# Patient Record
Sex: Male | Born: 1990 | Hispanic: No | Marital: Married | Smoking: Never smoker
Health system: Southern US, Community
[De-identification: ages and names within clinical notes are randomized; demographics above are authoritative.]

---

## 2018-08-11 ENCOUNTER — Telehealth: Payer: BLUE CROSS/BLUE SHIELD | Admitting: Nurse Practitioner

## 2018-08-11 DIAGNOSIS — Z7189 Other specified counseling: Secondary | ICD-10-CM

## 2018-08-11 NOTE — Progress Notes (Signed)
E-Visit for Corona Virus Screening  Based on your current symptoms, you may very well have the virus, however your symptoms are mild. Currently, not all patients are being tested. If the symptoms are mild and there is not a known exposure, performing the test is not indicated.  Coronavirus disease 2019 (COVID-19) is a respiratory illness that can spread from person to person. The virus that causes COVID-19 is a new virus that was first identified in the country of [place] but is now found in multiple other countries and has spread to the [place].  Symptoms associated with the virus are mild to severe fever, cough, and shortness of breath. There is currently no vaccine to protect against COVID-19, and there is no specific antiviral treatment for the virus.   To be considered HIGH RISK for Coronavirus (COVID-19), you have to meet the following criteria:  . Traveled to [place], [place], [place], [place] or [place]; or in the [place] to [place], [place], [place], or [place]; and have fever, cough, and shortness of breath within the last 2 weeks of travel OR  . Been in close contact with a person diagnosed with COVID-19 within the last 2 weeks and have fever, cough, and shortness of breath  . IF YOU DO NOT MEET THESE CRITERIA, YOU ARE CONSIDERED LOW RISK FOR COVID-19.   It is vitally important that if you feel that you have an infection such as this virus or any other virus that you stay home and away from places where you may spread it to others.  You should self-quarantine for 14 days if you have symptoms that could potentially be coronavirus and avoid contact with people age 65 and older.   You can use medication such as delsym or mucinex for cough  You may also take acetaminophen (Tylenol) as needed for fever.   Reduce your risk of any infection by using the same precautions used for avoiding the common cold or flu:  . Wash your hands often with soap and warm water for at least 20  seconds.  If soap and water are not readily available, use an alcohol-based hand sanitizer with at least 60% alcohol.  . If coughing or sneezing, cover your mouth and nose by coughing or sneezing into the elbow areas of your shirt or coat, into a tissue or into your sleeve (not your hands). . Avoid shaking hands with others and consider head nods or verbal greetings only. . Avoid touching your eyes, nose, or mouth with unwashed hands.  . Avoid close contact with people who are sick. . Avoid places or events with large numbers of people in one location, like concerts or sporting events. . Carefully consider travel plans you have or are making. . If you are planning any travel outside or inside the [place], visit the CDC's Travelers' Health webpage for the latest health notices. . If you have some symptoms but not all symptoms, continue to monitor at home and seek medical attention if your symptoms worsen. . If you are having a medical emergency, call 911.  HOME CARE . Only take medications as instructed by your medical team. . Drink plenty of fluids and get plenty of rest. . A steam or ultrasonic humidifier can help if you have congestion.   GET HELP RIGHT AWAY IF: . You develop worsening fever. . You become short of breath . You cough up blood. . Your symptoms become more severe MAKE SURE YOU   Understand these instructions.    Will watch your condition.  Will get help right away if you are not doing well or get worse.  Your e-visit answers were reviewed by a board certified advanced clinical practitioner to complete your personal care plan.  Depending on the condition, your plan could have included both over the counter or prescription medications.  If there is a problem please reply once you have received a response from your provider. Your safety is important to us.  If you have drug allergies check your prescription carefully.    You can use MyChart to ask questions about today's visit,  request a non-urgent call back, or ask for a work or school excuse for 24 hours related to this e-Visit. If it has been greater than 24 hours you will need to follow up with your provider, or enter a new e-Visit to address those concerns. You will get an e-mail in the next two days asking about your experience.  I hope that your e-visit has been valuable and will speed your recovery. Thank you for using e-visits.  5 minutes spent reviewing and documenting in chart.   

## 2018-08-13 ENCOUNTER — Other Ambulatory Visit: Payer: Self-pay | Admitting: Family

## 2019-07-28 ENCOUNTER — Emergency Department (HOSPITAL_COMMUNITY): Payer: Worker's Compensation

## 2019-07-28 ENCOUNTER — Other Ambulatory Visit: Payer: Self-pay

## 2019-07-28 ENCOUNTER — Emergency Department (HOSPITAL_COMMUNITY)
Admission: EM | Admit: 2019-07-28 | Discharge: 2019-07-28 | Disposition: A | Discharge status: Home / Self Care | Payer: Worker's Compensation | Attending: Emergency Medicine | Admitting: Emergency Medicine

## 2019-07-28 DIAGNOSIS — W010XXA Fall on same level from slipping, tripping and stumbling without subsequent striking against object, initial encounter: Secondary | ICD-10-CM | POA: Insufficient documentation

## 2019-07-28 DIAGNOSIS — M25562 Pain in left knee: Secondary | ICD-10-CM | POA: Insufficient documentation

## 2019-07-28 DIAGNOSIS — Y990 Civilian activity done for income or pay: Secondary | ICD-10-CM | POA: Insufficient documentation

## 2019-07-28 NOTE — ED Notes (Signed)
Pt verbalizes understanding of DC instructions. Pt belongings returned and is ambulatory out of ED.  

## 2019-07-28 NOTE — ED Provider Notes (Addendum)
[facility] DEPT Provider Note   CSN: [id number] Arrival date & time: 07/28/19  2106     History Chief Complaint  Patient presents with  . Knee Pain    left    Darin Phillips is a 29 y.o. male.  The history is provided by the patient.  Knee Pain Location:  Knee Injury: yes   Mechanism of injury: fall   Knee location:  L knee Pain details:    Quality:  Aching   Radiates to:  Does not radiate   Severity:  Mild   Onset quality:  Sudden   Timing:  Constant   Progression:  Unchanged Chronicity:  New Relieved by:  Nothing Worsened by:  Bearing weight Associated symptoms: no back pain, no decreased ROM, no fatigue, no fever, no itching, no muscle weakness, no neck pain, no numbness, no stiffness, no swelling and no tingling        No past medical history on file.  There are no problems to display for this patient.     No family history on file.  Social History   Tobacco Use  . Smoking status: Not on file  Substance Use Topics  . Alcohol use: Not on file  . Drug use: Not on file    Home Medications Prior to Admission medications   Not on File    Allergies    Patient has no allergy information on record.  Review of Systems   Review of Systems  Constitutional: Negative for fatigue and fever.  Musculoskeletal: Positive for arthralgias and gait problem. Negative for back pain, joint swelling, myalgias, neck pain, neck stiffness and stiffness.  Skin: Negative for color change, itching, pallor, rash and wound.  Neurological: Negative for weakness and numbness.    Physical Exam Updated Vital Signs  ED Triage Vitals  Enc Vitals Group     BP 07/28/19 2127 (!) 137/98     Pulse Rate 07/28/19 2127 85     Resp 07/28/19 2127 18     Temp 07/28/19 2127 99.5 F (37.5 C)     Temp Source 07/28/19 2127 Oral     SpO2 07/28/19 2113 98 %     Weight --      Height --      Head Circumference --      Peak Flow --      Pain Score  07/28/19 2115 7     Pain Loc --      Pain Edu? --      Excl. in [place]? --     Physical Exam Vitals and nursing note reviewed.  Constitutional:      General: He is not in acute distress.    Appearance: He is well-developed. He is not ill-appearing.  HENT:     Head: Normocephalic and atraumatic.  Cardiovascular:     Pulses: Normal pulses.  Abdominal:     Tenderness: There is abdominal tenderness.  Musculoskeletal:        General: Swelling (left knee) and tenderness (left knee area) present. No deformity. Normal range of motion.     Cervical back: Normal range of motion.     Comments: Tenderness over left anterior knee, no obvious deformity  Skin:    General: Skin is warm and dry.     Capillary Refill: Capillary refill takes less than 2 seconds.  Neurological:     Mental Status: He is alert and oriented to person, place, and time.     Sensory: No sensory deficit.  Motor: No weakness.     Comments: 5+ out of 5 strength in left lower extremity, normal sensation     ED Results / Procedures / Treatments   Labs (all labs ordered are listed, but only abnormal results are displayed) Labs Reviewed - No data to display  EKG None  Radiology DG Knee Complete 4 Views Left  Result Date: 07/28/2019 CLINICAL DATA:  Pain. EXAM: LEFT KNEE - COMPLETE 4+ VIEW COMPARISON:  None. FINDINGS: No evidence of fracture lucency or dislocation. No evidence of arthropathy or other focal bone abnormality. Mild soft tissue edema in the ventral left knee. Small suprapatellar joint effusion with edema in Hoffa's fat pad. Normal bone mineralization. IMPRESSION: No acute bony abnormality. Left knee soft tissue edema and small joint effusion. Electronically Signed   By: Laurence Ferrari   On: 07/28/2019 22:01    Procedures Procedures (including critical care time)  Medications Ordered in ED Medications - No data to display  ED Course  I have reviewed the triage vital signs and the nursing  notes.  Pertinent labs & imaging results that were available during my care of the patient were reviewed by me and considered in my medical decision making (see chart for details).    MDM Rules/Calculators/A&P                      Darin Phillips is a 29 year old male who presents to the ED with left knee pain after fall.  Patient with normal vitals.  No fever.  Normal range of motion of the left lower extremity.  Good pulses, no edema.  Some tenderness around the anterior part of the patella.  Has 5+ out of 5 strength in left lower extremity, normal sensation.  X-ray showed no acute fracture or dislocation.  Patient has some mild soft tissue edema and small joint effusion.  Likely has contusion.  Does not have any obvious laxity on exam.  Possibly a knee sprain.  Already uses a knee brace for this left knee.  Will give crutches.  Recommend ice, Tylenol, Motrin.  Weightbearing as tolerated.  Given information to follow-up with sports medicine if needed.  Discharged in good condition.  This chart was dictated using voice recognition software.  Despite best efforts to proofread,  errors can occur which can change the documentation meaning.   Final Clinical Impression(s) / ED Diagnoses Final diagnoses:  Acute pain of left knee    Rx / DC Orders ED Discharge Orders    None       Virgina Norfolk, DO 07/28/19 2222    Virgina Norfolk, DO 07/28/19 2222

## 2019-07-28 NOTE — ED Triage Notes (Signed)
Pt arrived via [facility] assisted into ED in wheelchair  from work CC Left knee injury/pain. 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 Per EMS A/OX4 ambulatory with assistance.    VSS Afebrile

## 2019-08-02 ENCOUNTER — Ambulatory Visit: Payer: Self-pay

## 2019-08-02 ENCOUNTER — Other Ambulatory Visit: Payer: Self-pay

## 2019-08-02 ENCOUNTER — Encounter: Payer: Self-pay | Admitting: Family Medicine

## 2019-08-02 ENCOUNTER — Ambulatory Visit (INDEPENDENT_AMBULATORY_CARE_PROVIDER_SITE_OTHER): Payer: Worker's Compensation | Admitting: Family Medicine

## 2019-08-02 VITALS — BP 128/80 | Ht 69.0 in | Wt 250.0 lb

## 2019-08-02 DIAGNOSIS — S83002D Unspecified subluxation of left patella, subsequent encounter: Secondary | ICD-10-CM | POA: Insufficient documentation

## 2019-08-02 DIAGNOSIS — S83002A Unspecified subluxation of left patella, initial encounter: Secondary | ICD-10-CM

## 2019-08-02 DIAGNOSIS — M25562 Pain in left knee: Secondary | ICD-10-CM

## 2019-08-02 NOTE — Patient Instructions (Signed)
Nice to meet you Please try ice  Please try the brace  Please send me a MyChart in 2-3 weeks to start physical therapy.  Try ibuprofen as needed  Please try the exercises  Please send me a message in MyChart with any questions or updates.  Please see me back in 4 weeks.   --Dr. Jordan Likes

## 2019-08-02 NOTE — Assessment & Plan Note (Signed)
He had an injury at work on 3/18.  He felt like he may have had the kneecap transition.  Findings on ultrasound would suggest a patellar subluxation. -Brace. -Counseled on home exercise therapy and supportive care. -Could try physical therapy in 2 to 3 weeks. -Follow-up in 4 weeks.  Could consider MRI if still symptomatic at that time.

## 2019-08-02 NOTE — Progress Notes (Signed)
Darin Phillips - 29 y.o. male MRN [id number]  Date of birth: [birth year]  SUBJECTIVE:  Including CC & ROS.  Chief Complaint  Patient presents with  . Knee Injury    left knee x 07/28/2019    Darin Phillips is a 29 y.o. male that is presenting with left knee pain.  The pain has occurred after an injury sustained at work on/18.  He was seen in the emergency room and imaging was normal at that time.  He was placed on crutches and his pain since that time.  Independent review of the left knee x-ray from 3/18 shows no acute abnormality.   Review of Systems See HPI   HISTORY: Past Medical, Surgical, Social, and Family History Reviewed & Updated per EMR.   Pertinent Historical Findings include:  History reviewed. No pertinent past medical history.  History reviewed. No pertinent surgical history.  History reviewed. No pertinent family history.  Social History   Socioeconomic History  . Marital status: Married    Spouse name: Not on file  . Number of children: Not on file  . Years of education: Not on file  . Highest education level: Not on file  Occupational History  . Not on file  Tobacco Use  . Smoking status: Never Smoker  . Smokeless tobacco: Never Used  Substance and Sexual Activity  . Alcohol use: Not on file  . Drug use: Not on file  . Sexual activity: Not on file  Other Topics Concern  . Not on file  Social History Narrative  . Not on file   Social Determinants of Health   Financial Resource Strain:   . Difficulty of Paying Living Expenses:   Food Insecurity:   . Worried About Charity fundraiser in the Last Year:   . Arboriculturist in the Last Year:   Transportation Needs:   . Film/video editor (Medical):   Marland Kitchen Lack of Transportation (Non-Medical):   Physical Activity:   . Days of Exercise per Week:   . Minutes of Exercise per Session:   Stress:   . Feeling of Stress :   Social Connections:   . Frequency of Communication with Friends and Family:     . Frequency of Social Gatherings with Friends and Family:   . Attends Religious Services:   . Active Member of Clubs or Organizations:   . Attends Archivist Meetings:   Marland Kitchen Marital Status:   Intimate Partner Violence:   . Fear of Current or Ex-Partner:   . Emotionally Abused:   Marland Kitchen Physically Abused:   . Sexually Abused:      PHYSICAL EXAM:  VS: BP 128/80   Ht 5\' 9"  (1.753 m)   Wt 250 lb (113.4 kg)   BMI 36.92 kg/m  Physical Exam Gen: NAD, alert, cooperative with exam, well-appearing MSK:  Left knee: Swelling occurring on the medial lateral aspect of the Phillips. No obvious joint effusion. Limited extension. Normal flexion. No instability to valgus or varus stress testing. Negative McMurray's test. Neurovascular intact  Limited ultrasound: Left knee:  No effusion within the suprapatellar pouch. There are effusions in the medial lateral aspect of the Phillips.  There is increased hyperemia of the retinaculum on the medial retinaculum.  This is likely the result of patellar subluxation with this retinacular change and effusion. Normal-appearing quadricep and patellar tendon. No significant changes on the medial joint line or medial meniscus. Normal-appearing lateral joint space and lateral meniscus.  Summary: Findings would suggest  a patellar subluxation.  Ultrasound and interpretation by Clare Gandy, MD    ASSESSMENT & PLAN:   Patellar subluxation, left, initial encounter He had an injury at work on 3/18.  He felt like he may have had the kneecap transition.  Findings on ultrasound would suggest a patellar subluxation. -Brace. -Counseled on home exercise therapy and supportive care. -Could try physical therapy in 2 to 3 weeks. -Follow-up in 4 weeks.  Could consider MRI if still symptomatic at that time.

## 2019-08-12 ENCOUNTER — Ambulatory Visit: Payer: BC Managed Care – PPO | Attending: Internal Medicine

## 2019-08-12 DIAGNOSIS — Z23 Encounter for immunization: Secondary | ICD-10-CM

## 2019-08-12 NOTE — Progress Notes (Signed)
   Covid-19 Vaccination Clinic  Name:  Darin Phillips    MRN: [id number] DOB: 06-12-1990  08/12/2019  [name] was observed post Covid-19 immunization for 15 minutes without incident. He was provided with Vaccine Information Sheet and instruction to access the V-Safe system.   [name] was instructed to call 911 with any severe reactions post vaccine: Marland Kitchen Difficulty breathing  . Swelling of face and throat  . A fast heartbeat  . A bad rash all over body  . Dizziness and weakness   Immunizations Administered    Name Date Dose VIS Date Route   Pfizer COVID-19 Vaccine 08/12/2019 12:02 PM 0.3 mL 04/22/2019 Intramuscular   Manufacturer: [employer], [employer]   Lot: [id number]   NDC: [address]-3

## 2019-08-30 ENCOUNTER — Ambulatory Visit (INDEPENDENT_AMBULATORY_CARE_PROVIDER_SITE_OTHER): Payer: Worker's Compensation | Admitting: Family Medicine

## 2019-08-30 ENCOUNTER — Other Ambulatory Visit: Payer: Self-pay

## 2019-08-30 ENCOUNTER — Encounter: Payer: Self-pay | Admitting: Family Medicine

## 2019-08-30 VITALS — BP 125/82 | HR 88 | Ht 69.0 in | Wt 250.0 lb

## 2019-08-30 DIAGNOSIS — S83002D Unspecified subluxation of left patella, subsequent encounter: Secondary | ICD-10-CM | POA: Diagnosis not present

## 2019-08-30 NOTE — Patient Instructions (Signed)
Good to see you Please continue ice  You can use the brace as needed  Physical therapy will give you a call.   Please send me a message in MyChart with any questions or updates.  Please see me back in 6 weeks.   --Dr. Jordan Likes

## 2019-08-30 NOTE — Assessment & Plan Note (Signed)
Doing well since his injury at work.  Occurred on 3/18.  Does have some lack of flexion. -Counseled on home exercise therapy and supportive care. -Referral to physical therapy. -Can discontinue the brace or use as needed. -If still bothersome would consider MRI.

## 2019-08-30 NOTE — Progress Notes (Signed)
  Darin Phillips - 29 y.o. male MRN [id number]  Date of birth: [birth year]  SUBJECTIVE:  Including CC & ROS.  Chief Complaint  Patient presents with  . Follow-up    follow up for left knee    Darin Phillips is a 29 y.o. male that is following up for his left knee pain.  He has been doing well and has improved extension.  He still has some limitations in flexion.  Denies any significant pain.   Review of Systems See HPI   HISTORY: Past Medical, Surgical, Social, and Family History Reviewed & Updated per EMR.   Pertinent Historical Findings include:  No past medical history on file.  No past surgical history on file.  No family history on file.  Social History   Socioeconomic History  . Marital status: Married    Spouse name: Not on file  . Number of children: Not on file  . Years of education: Not on file  . Highest education level: Not on file  Occupational History  . Not on file  Tobacco Use  . Smoking status: Never Smoker  . Smokeless tobacco: Never Used  Substance and Sexual Activity  . Alcohol use: Not on file  . Drug use: Not on file  . Sexual activity: Not on file  Other Topics Concern  . Not on file  Social History Narrative  . Not on file   Social Determinants of Health   Financial Resource Strain:   . Difficulty of Paying Living Expenses:   Food Insecurity:   . Worried About Programme researcher, broadcasting/film/video in the Last Year:   . Barista in the Last Year:   Transportation Needs:   . Freight forwarder (Medical):   Marland Kitchen Lack of Transportation (Non-Medical):   Physical Activity:   . Days of Exercise per Week:   . Minutes of Exercise per Session:   Stress:   . Feeling of Stress :   Social Connections:   . Frequency of Communication with Friends and Family:   . Frequency of Social Gatherings with Friends and Family:   . Attends Religious Services:   . Active Member of Clubs or Organizations:   . Attends Banker Meetings:   Marland Kitchen Marital  Status:   Intimate Partner Violence:   . Fear of Current or Ex-Partner:   . Emotionally Abused:   Marland Kitchen Physically Abused:   . Sexually Abused:      PHYSICAL EXAM:  VS: BP 125/82   Pulse 88   Ht 5\' 9"  (1.753 m)   Wt 250 lb (113.4 kg)   BMI 36.92 kg/m  Physical Exam Gen: NAD, alert, cooperative with exam, well-appearing MSK:  Left knee: Mild effusion. No instability with valgus or varus stress testing. Negative McMurray and Thessaly test. Limited flexion compared to contralateral side. Normal extension. Normal strength resistance. Neurovascularly intact     ASSESSMENT & PLAN:   Patellar subluxation, left, subsequent encounter Doing well since his injury at work.  Occurred on 3/18.  Does have some lack of flexion. -Counseled on home exercise therapy and supportive care. -Referral to physical therapy. -Can discontinue the brace or use as needed. -If still bothersome would consider MRI.

## 2019-09-05 ENCOUNTER — Ambulatory Visit: Payer: BC Managed Care – PPO | Attending: Internal Medicine

## 2019-09-05 DIAGNOSIS — Z23 Encounter for immunization: Secondary | ICD-10-CM

## 2019-09-05 NOTE — Progress Notes (Signed)
   Covid-19 Vaccination Clinic  Name:  Darin Phillips    MRN: [id number] DOB: [birth year]  09/05/2019  [name] was observed post Covid-19 immunization for 15 minutes without incident. He was provided with Vaccine Information Sheet and instruction to access the V-Safe system.   [name] was instructed to call 911 with any severe reactions post vaccine: Marland Kitchen Difficulty breathing  . Swelling of face and throat  . A fast heartbeat  . A bad rash all over body  . Dizziness and weakness   Immunizations Administered    Name Date Dose VIS Date Route   Pfizer COVID-19 Vaccine 09/05/2019  1:01 PM 0.3 mL 07/06/2018 Intramuscular   Manufacturer: [employer], [employer]   Lot: [id number]   NDC: [address]-8

## 2020-09-25 IMAGING — CR DG KNEE COMPLETE 4+V*L*
4 series · 4 of 4 positions shown · non-contrast
Comparison: None.

CLINICAL DATA: Pain.

EXAM:
LEFT KNEE - COMPLETE 4+ VIEW

[t knee ap left]
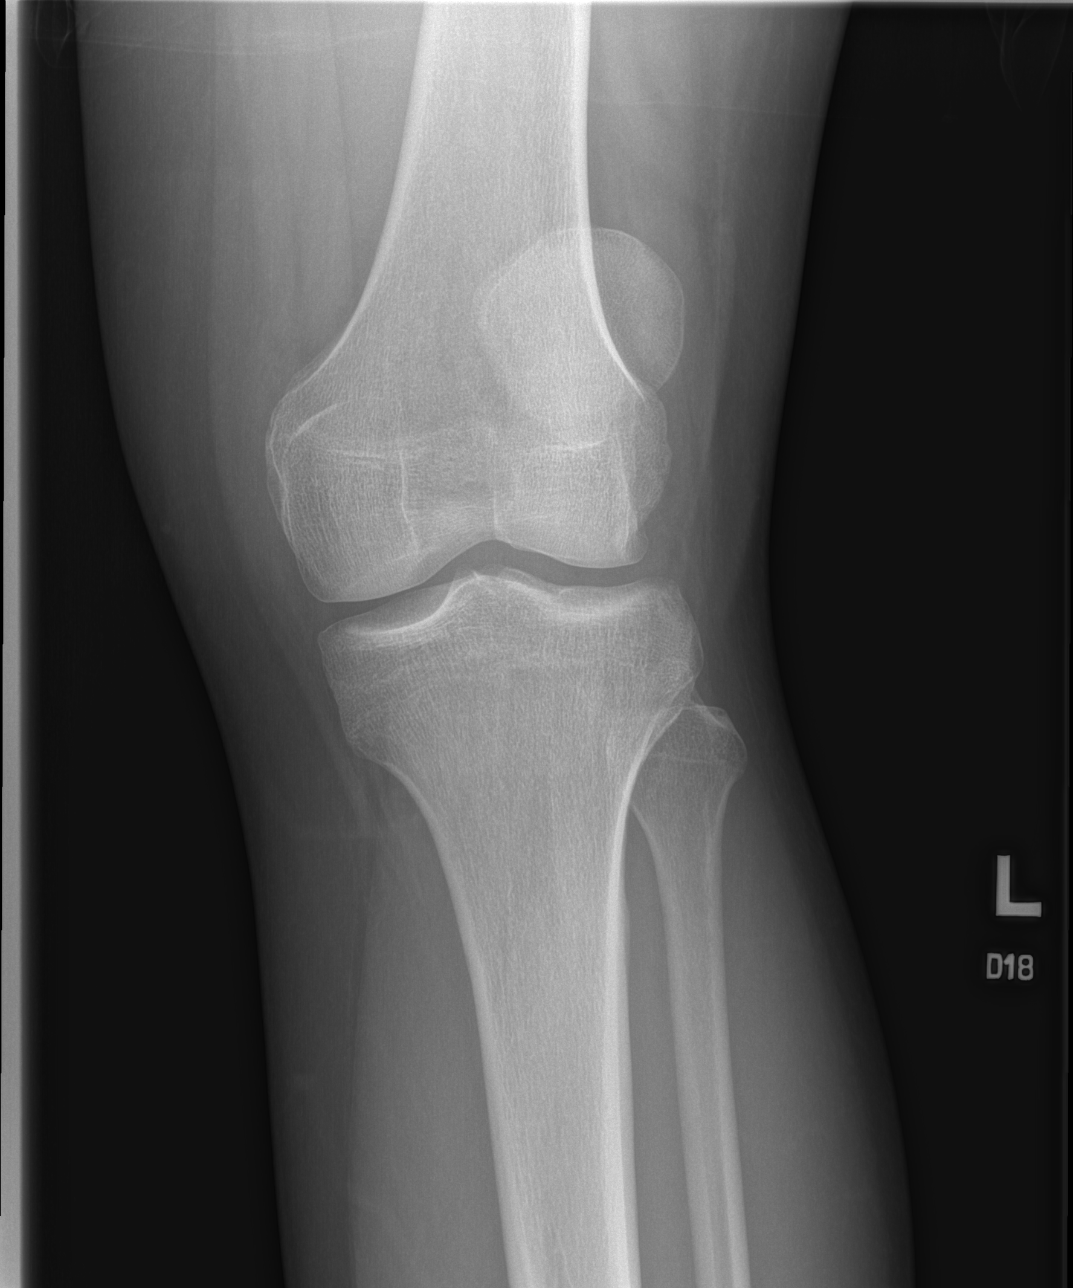

[t knee obl left (1 of 2)]
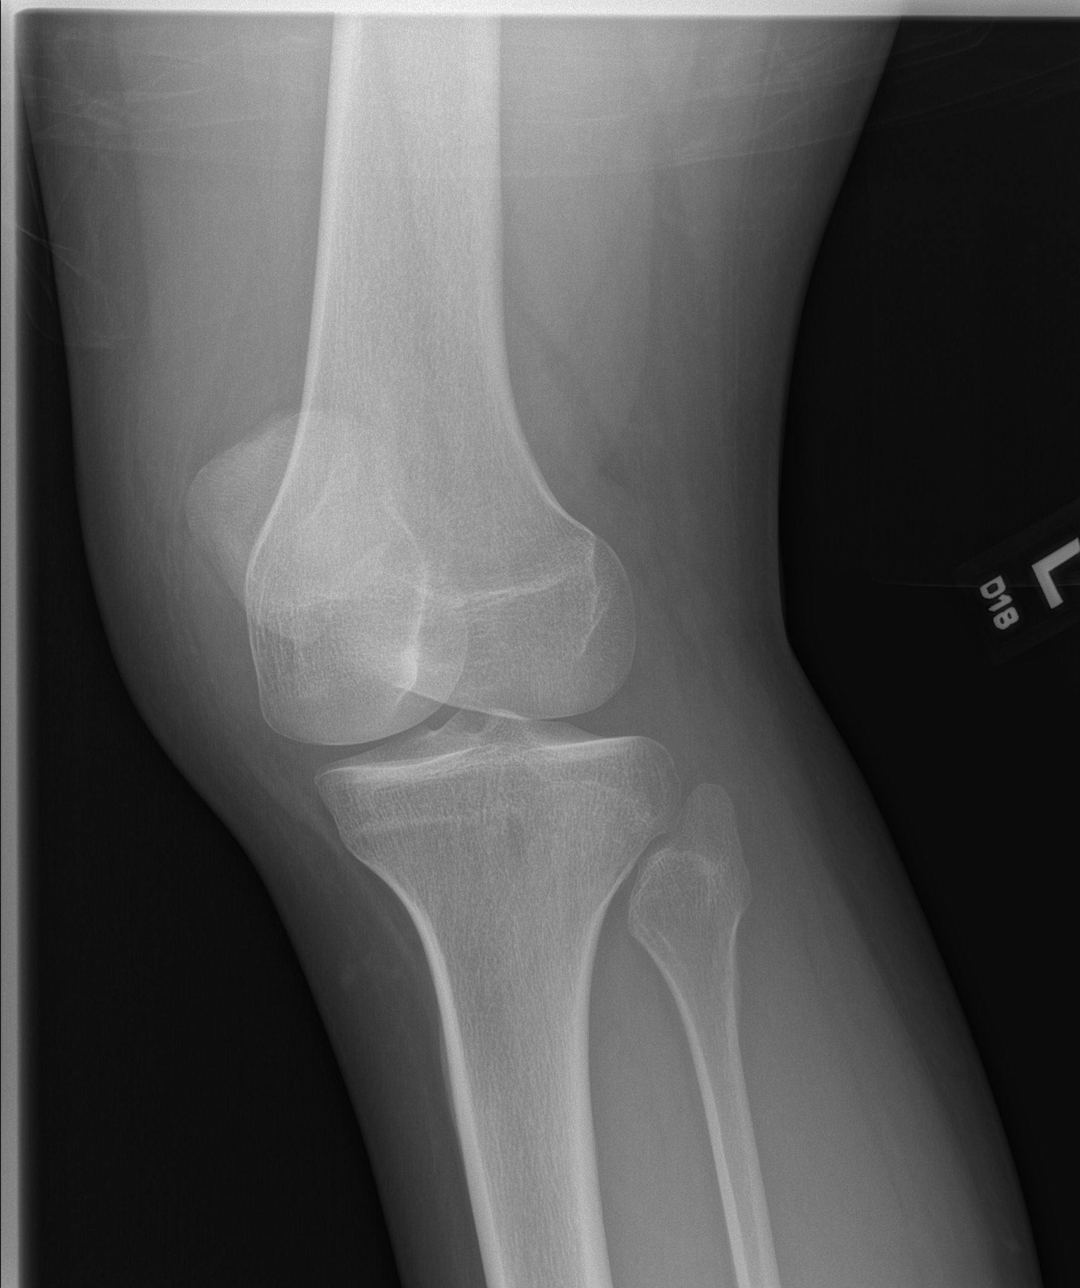

[t knee obl left (2 of 2)]
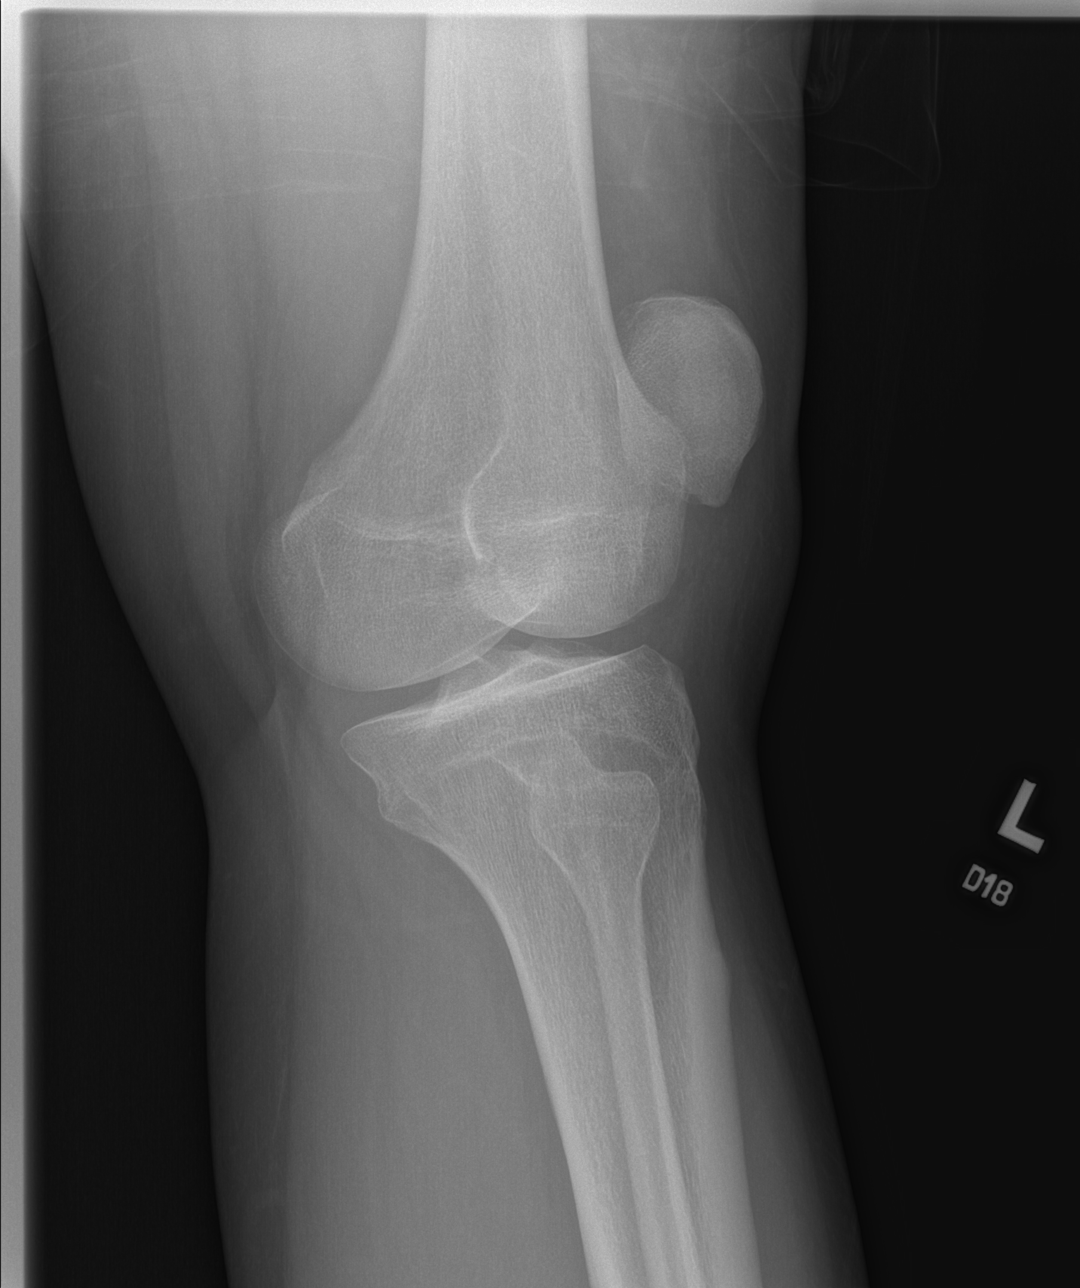

[t knee lat left]
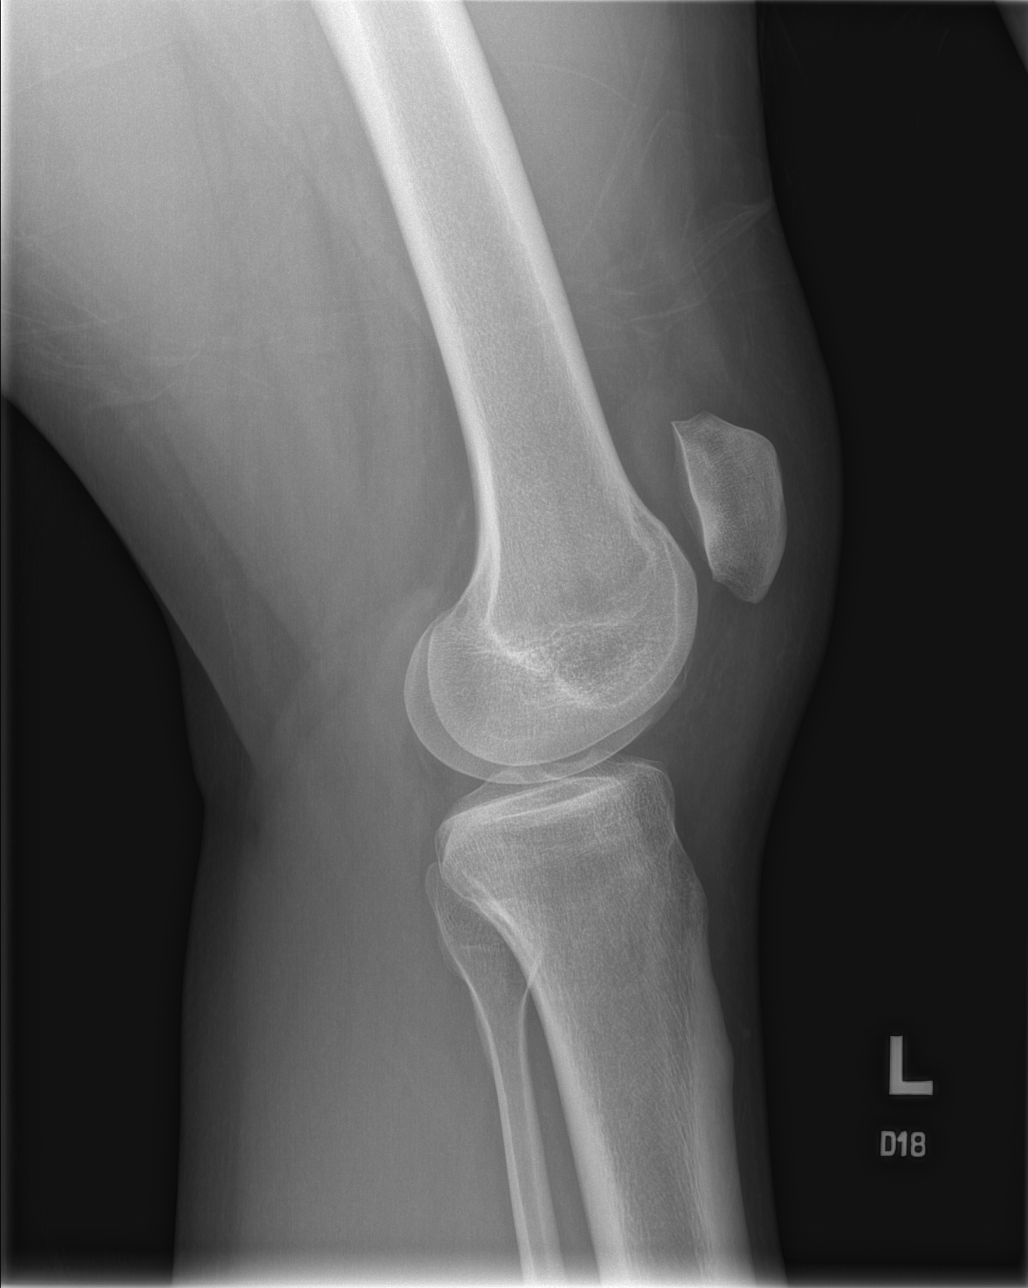

[4 of 4 positions shown; findings below may reference images not displayed]

FINDINGS: No evidence of fracture lucency or dislocation. No evidence of
arthropathy or other focal bone abnormality. Mild soft tissue edema
in the ventral left knee. Small suprapatellar joint effusion with
edema in Hoffa's fat pad. Normal bone mineralization.
IMPRESSION: No acute bony abnormality.

Left knee soft tissue edema and small joint effusion.

## 2022-08-25 ENCOUNTER — Encounter: Payer: Self-pay | Admitting: *Deleted
